# Patient Record
Sex: Male | Born: 1965 | Race: White | Hispanic: No | Marital: Married | Smoking: Never smoker
Health system: Southern US, Community
[De-identification: ages and names within clinical notes are randomized; demographics above are authoritative.]

## PROBLEM LIST (undated history)

## (undated) DIAGNOSIS — T753XXA Motion sickness, initial encounter: Secondary | ICD-10-CM

## (undated) HISTORY — PX: NO PAST SURGERIES: SHX2092

---

## 2013-04-13 ENCOUNTER — Ambulatory Visit: Payer: Self-pay | Admitting: Orthopedic Surgery

## 2015-07-25 ENCOUNTER — Ambulatory Visit (INDEPENDENT_AMBULATORY_CARE_PROVIDER_SITE_OTHER): Payer: PRIVATE HEALTH INSURANCE

## 2015-07-25 ENCOUNTER — Ambulatory Visit (INDEPENDENT_AMBULATORY_CARE_PROVIDER_SITE_OTHER): Payer: PRIVATE HEALTH INSURANCE | Admitting: Podiatry

## 2015-07-25 ENCOUNTER — Encounter: Payer: Self-pay | Admitting: Podiatry

## 2015-07-25 VITALS — BP 124/69 | HR 61 | Resp 16 | Ht 70.0 in | Wt 215.0 lb

## 2015-07-25 DIAGNOSIS — M779 Enthesopathy, unspecified: Secondary | ICD-10-CM

## 2015-07-25 DIAGNOSIS — M722 Plantar fascial fibromatosis: Secondary | ICD-10-CM | POA: Diagnosis not present

## 2015-07-25 MED ORDER — MELOXICAM 15 MG PO TABS: 15.0000 mg | ORAL_TABLET | Freq: Every day | ORAL | Status: DC

## 2015-07-25 MED ORDER — METHYLPREDNISOLONE 4 MG PO TBPK: ORAL_TABLET | ORAL | Status: DC

## 2015-07-25 NOTE — Patient Instructions (Signed)

## 2015-07-25 NOTE — Progress Notes (Signed)
   Subjective:    Patient ID: Joseph George, male    DOB: 11-Jan-1966, 49 y.o.   MRN: [id number]  HPI Comments: "I have pain in the bottom of my foot"  Patient c/o aching plantar forefoot right for a couple months. Initially, the pain was in the heel and noticed a lot of AM pain, but that has improved and more in forefoot now. Tried OTC insoles and has helped some.      Review of Systems  All other systems reviewed and are negative.      Objective:   Physical Exam: I have reviewed his past medical history his chief complaint medications allergy surgery social history and review of systems. Pulses are strongly palpable bilateral. Neurologic sensorium is intact percent one C monofilament. Deep tendon reflexes are intact bilateral and muscle strength is 5 over 5 dorsiflexion plantar flexors and inverters everters all intrinsic musculature is intact. Orthopedic evaluation demonstrates all joints distal to the ankle level for range of motion without crepitation. Cutaneous evaluation and straight supple well-hydrated cutis no erythema edema cellulitis drainage or odor. There is pain on palpation medial calcaneal tubercle of the right heel. Radiographic evaluation does demonstrates soft tissue increasing density at the plantar fascial calcaneal insertion site of the right heel.        Assessment & Plan:  Assessment: Plantar fasciitis right.  Plan: Discussed etiology pathology conservative versus surgical therapies at this point performed a injection to his right heel today with Kenalog and local and aesthetic. Discussed appropriate shoe gear stretching exercises and ice therapy. Dispensed a night splint and a plantar fascial brace. Also dispensed a prescription for Medrol Dosepak to be followed by meloxicam. We will follow-up with him in 1 month. Should he have questions or concerns he will notify us immediately.

## 2015-08-22 ENCOUNTER — Ambulatory Visit: Payer: PRIVATE HEALTH INSURANCE | Admitting: Podiatry

## 2015-08-24 ENCOUNTER — Encounter: Payer: Self-pay | Admitting: Podiatry

## 2015-08-24 ENCOUNTER — Ambulatory Visit (INDEPENDENT_AMBULATORY_CARE_PROVIDER_SITE_OTHER): Payer: PRIVATE HEALTH INSURANCE | Admitting: Podiatry

## 2015-08-24 VITALS — BP 140/76 | HR 65 | Resp 18

## 2015-08-24 DIAGNOSIS — M722 Plantar fascial fibromatosis: Secondary | ICD-10-CM | POA: Diagnosis not present

## 2015-08-24 MED ORDER — METHYLPREDNISOLONE 4 MG PO TBPK: ORAL_TABLET | ORAL | Status: DC

## 2015-08-24 MED ORDER — MELOXICAM 15 MG PO TABS: 15.0000 mg | ORAL_TABLET | Freq: Every day | ORAL | Status: DC

## 2015-08-24 NOTE — Progress Notes (Signed)
He presents today for follow-up of his plantar fasciitis right foot. He states that it really doesn't seem to be very much better at all. He states that he never got his medicine filled and he does not wear his boot.  Objective: Vital signs are stable alert and oriented 70. 49 year old male no acute distress ambulates with an antalgic gait. Pulses are palpable. He has pain on palpation medial calcaneal tubercle right heel. Neurologic sensory is intact. Deep tendon reflexes are intact. Muscle strength is normal bilateral.  Assessment: Chronic intractable plantar fasciitis right foot secondary to noncompliance.  Plan: Encouraged him to purchase and take his medication. Reinjected his right heel today. Encouraged him to utilize his plantar fascial brace and night splint. Follow up with him in 1 month at which time we will more than likely after repair orthotics built.

## 2015-09-21 ENCOUNTER — Encounter: Payer: Self-pay | Admitting: Podiatry

## 2015-09-21 ENCOUNTER — Ambulatory Visit (INDEPENDENT_AMBULATORY_CARE_PROVIDER_SITE_OTHER): Payer: PRIVATE HEALTH INSURANCE | Admitting: Podiatry

## 2015-09-21 VITALS — BP 115/70 | HR 67 | Resp 18

## 2015-09-21 DIAGNOSIS — M7751 Other enthesopathy of right foot: Secondary | ICD-10-CM | POA: Diagnosis not present

## 2015-09-21 DIAGNOSIS — M722 Plantar fascial fibromatosis: Secondary | ICD-10-CM | POA: Diagnosis not present

## 2015-09-21 DIAGNOSIS — M779 Enthesopathy, unspecified: Secondary | ICD-10-CM

## 2015-09-21 DIAGNOSIS — M778 Other enthesopathies, not elsewhere classified: Secondary | ICD-10-CM

## 2015-09-21 MED ORDER — DICLOFENAC SODIUM 75 MG PO TBEC: 75.0000 mg | DELAYED_RELEASE_TABLET | Freq: Two times a day (BID) | ORAL | Status: DC

## 2015-09-21 NOTE — Progress Notes (Signed)
He presents today states that his right heel is doing much better. He states that he no longer takes his medications or uses his splints. He continues to play but basketball on a regular basis and has noticed that his forefoot is starting to bother him. He states that his only after playing ball that he has some tenderness right here as he points to the second metatarsophalangeal joint of the right foot. He denies any other changes in his past mental history medications or allergies.  Objective: Vital signs are stable alert and oriented 3. Pulses are strongly palpable. Neurologic sensorium is intact. He has no reproducible pain on palpation or range of motion of the second metatarsophalangeal joint. No reproducible pain on palpation of the medial calcaneal tubercle of the right heel. He has no plantar pain in her midfoot on frontal plane range of motion has no pain on palpation of the Achilles or the peroneals or of the posterior tibial tendon.  Assessment: Well-healing plantar fasciitis right. Occasional capsulitis second metatarsophalangeal joint right.  Plan: Discussed etiology pathology conservative versus surgical therapies. I changed his medication from meloxicam to diclofenac 75 mg 1 by mouth twice a day 60 with 3 refills. We discussed appropriate shoe gear stretching sizes ice therapy and shoe gear modifications. Should his pain about the second metatarsophalangeal joint worsen he will notify us immediately.  Arbutus Ped DPM

## 2015-10-17 DIAGNOSIS — S46911A Strain of unspecified muscle, fascia and tendon at shoulder and upper arm level, right arm, initial encounter: Secondary | ICD-10-CM | POA: Insufficient documentation

## 2015-10-17 DIAGNOSIS — S161XXA Strain of muscle, fascia and tendon at neck level, initial encounter: Secondary | ICD-10-CM | POA: Insufficient documentation

## 2016-05-17 ENCOUNTER — Other Ambulatory Visit: Payer: Self-pay | Admitting: Orthopedic Surgery

## 2016-05-17 DIAGNOSIS — M2392 Unspecified internal derangement of left knee: Secondary | ICD-10-CM

## 2016-05-17 DIAGNOSIS — M25562 Pain in left knee: Secondary | ICD-10-CM

## 2016-05-29 ENCOUNTER — Ambulatory Visit
Admission: RE | Admit: 2016-05-29 | Discharge: 2016-05-29 | Disposition: A | Discharge status: Home / Self Care | Payer: Managed Care, Other (non HMO) | Source: Ambulatory Visit | Attending: Orthopedic Surgery | Admitting: Orthopedic Surgery

## 2016-05-29 DIAGNOSIS — M25562 Pain in left knee: Secondary | ICD-10-CM | POA: Insufficient documentation

## 2016-05-29 DIAGNOSIS — M2392 Unspecified internal derangement of left knee: Secondary | ICD-10-CM | POA: Diagnosis present

## 2016-06-19 DIAGNOSIS — M2392 Unspecified internal derangement of left knee: Secondary | ICD-10-CM | POA: Insufficient documentation

## 2016-12-10 IMAGING — MR MR KNEE*L* W/O CM
6 series · 38 of 40 positions shown · non-contrast
Comparison: None.

CLINICAL DATA: Medial knee pain.  Injured playing basketball.

EXAM:
MRI OF THE LEFT KNEE WITHOUT CONTRAST
TECHNIQUE: Multiplanar, multisequence MR imaging of the knee was performed. No
intravenous contrast was administered.

[Series 3: PD fat-sat · axial · 3.0mm · 0.33mm/px · z∈[-60,+52]mm · 9 of 35 slices shown (1 of 4)]
[im 1/35]
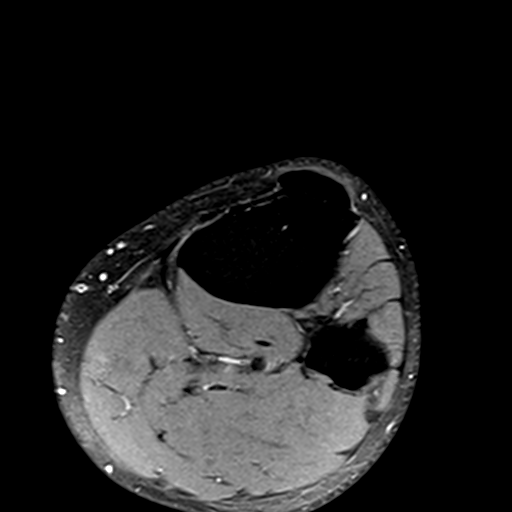
[im 5/35]
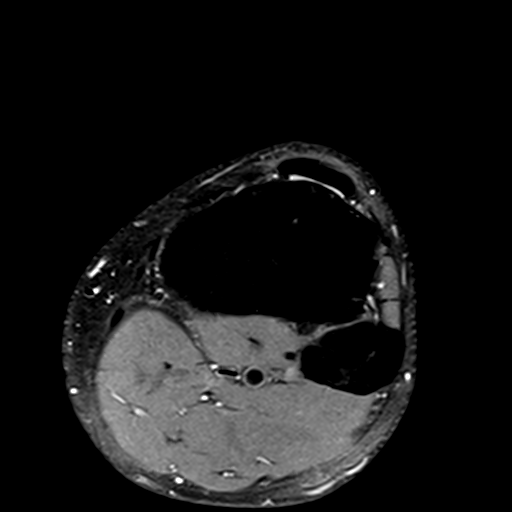
[im 9/35]
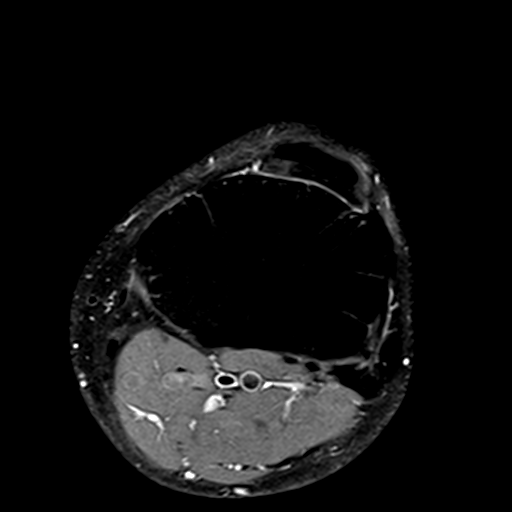
[im 13/35]
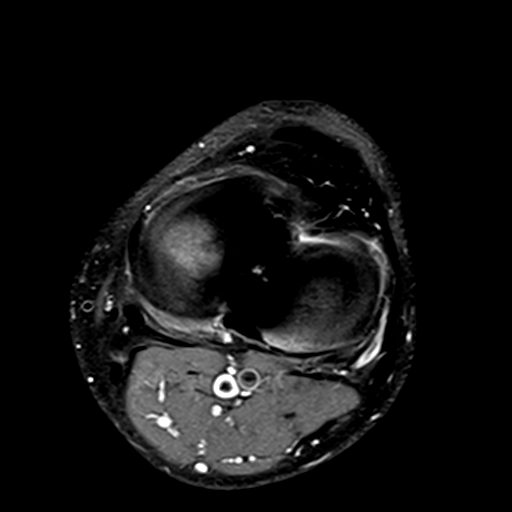
[im 18/35]
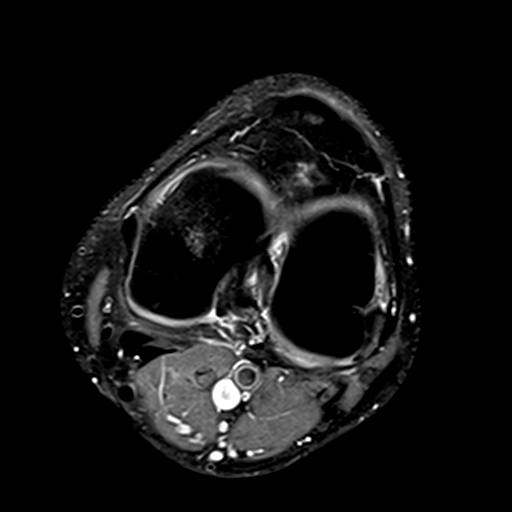
[im 22/35]
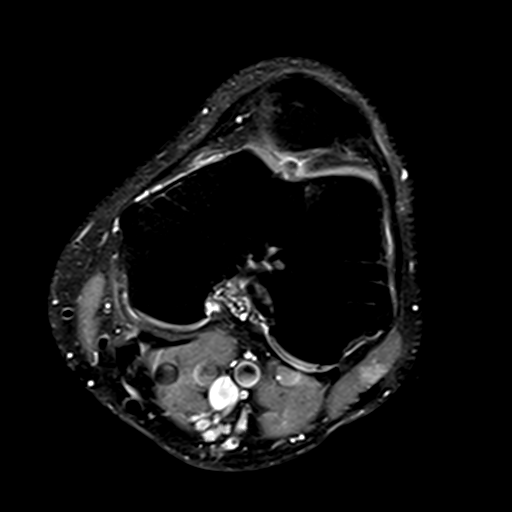
[im 26/35]
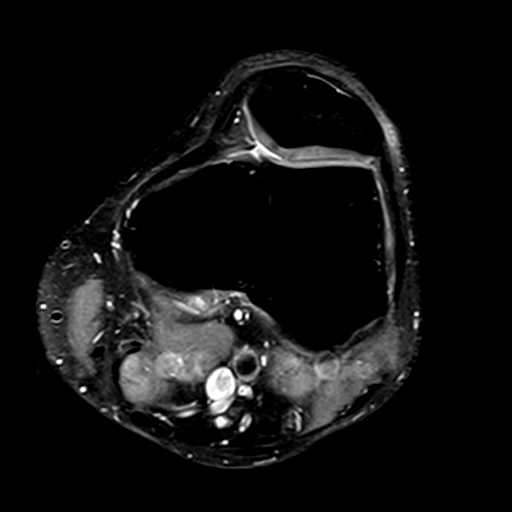
[im 30/35]
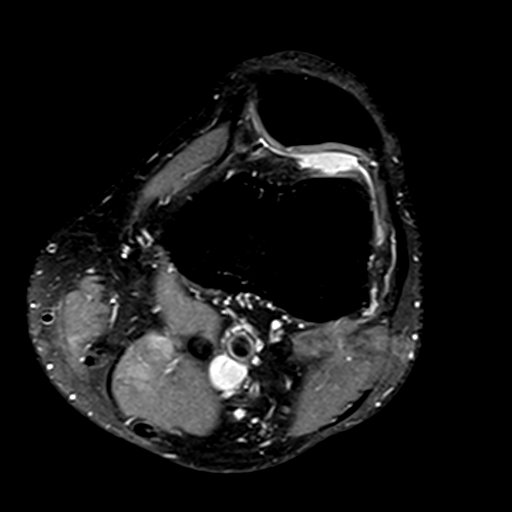
[im 35/35]
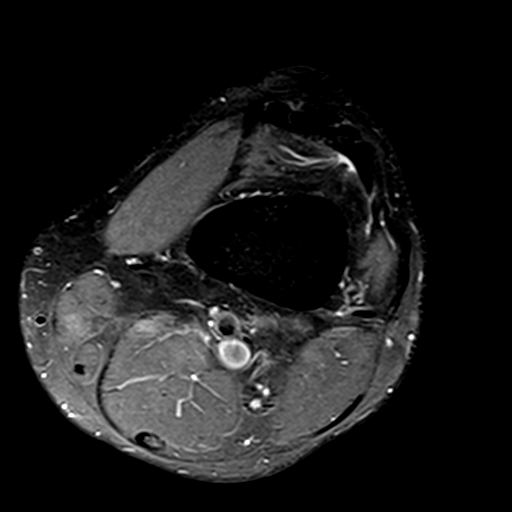

[Series 4: T1 · coronal · 3.0mm · 0.50mm/px · 5 of 34 slices shown]
[im 1/34]
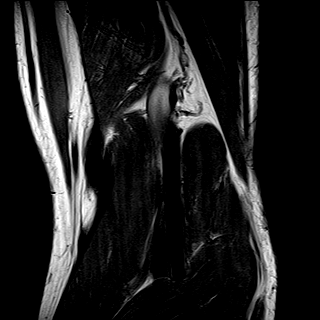
[im 6/34]
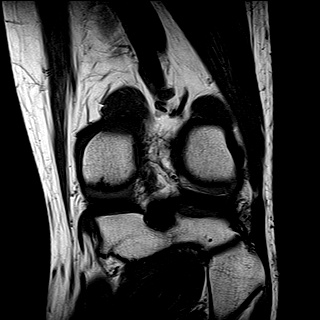
[im 12/34]
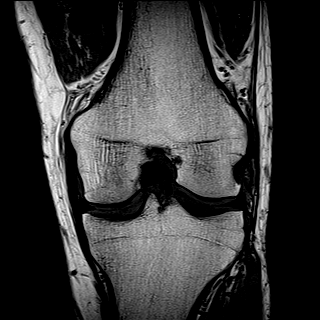
[im 17/34]
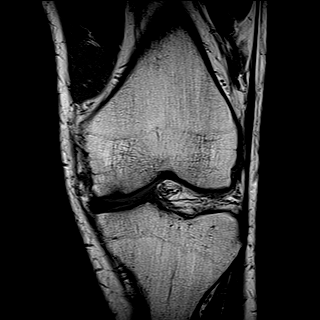
[im 23/34]
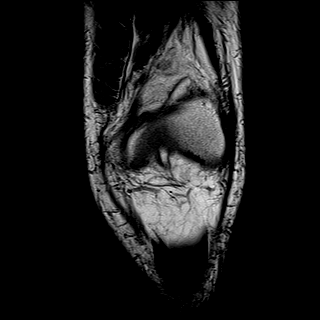

[Series 5: PD fat-sat · sagittal · 3.0mm · 0.62mm/px · 7 of 33 slices shown (2 of 4)]
[im 1/33]
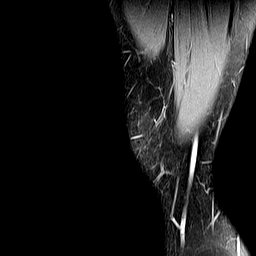
[im 6/33]
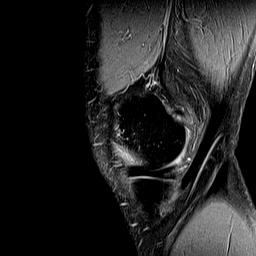
[im 11/33]
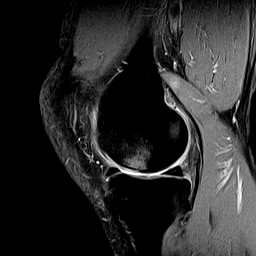
[im 17/33]
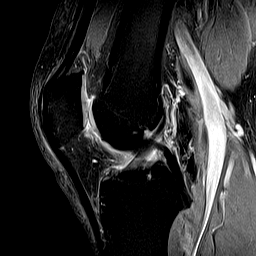
[im 22/33]
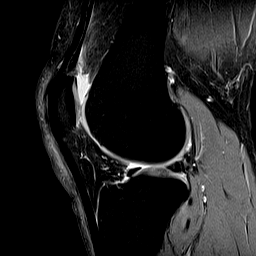
[im 27/33]
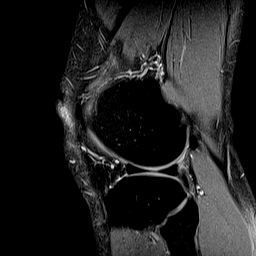
[im 33/33]
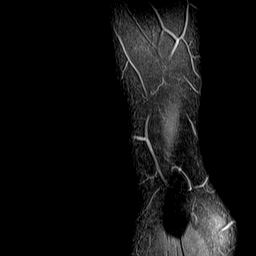

[Series 6: T2 fat-sat · coronal · 3.0mm · 0.50mm/px · 7 of 34 slices shown]
[im 1/34]
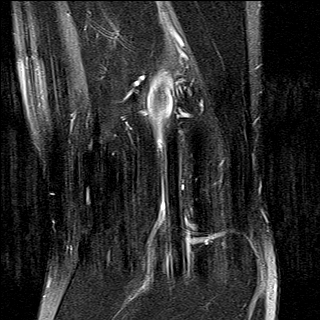
[im 6/34]
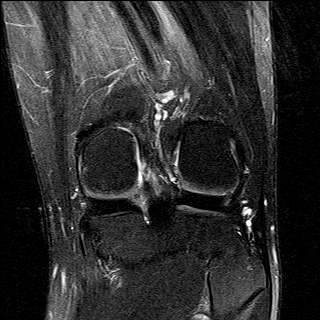
[im 12/34]
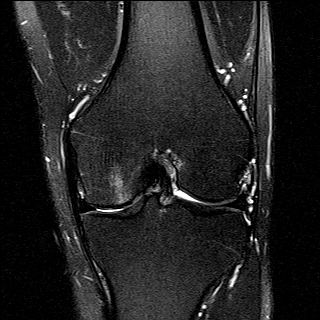
[im 17/34]
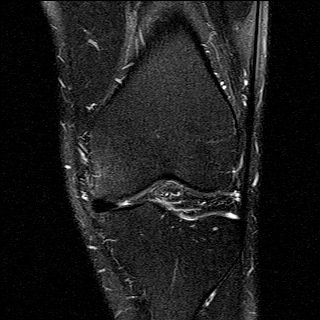
[im 23/34]
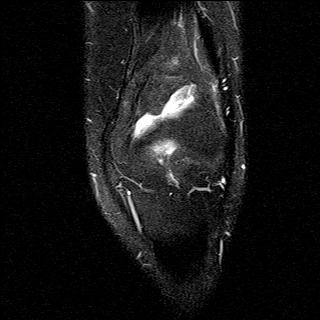
[im 28/34]
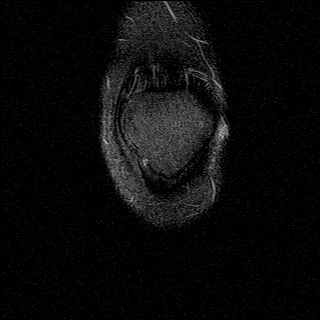
[im 34/34]
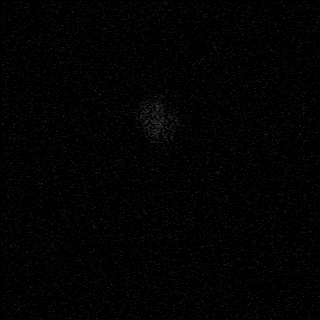

[Series 7: PD fat-sat · coronal · 3.0mm · 0.62mm/px · 7 of 34 slices shown (3 of 4)]
[im 1/34]
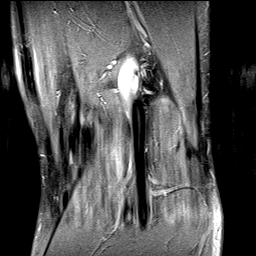
[im 6/34]
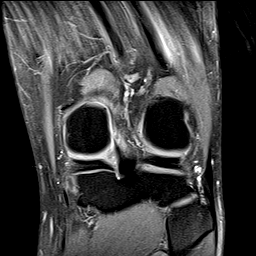
[im 12/34]
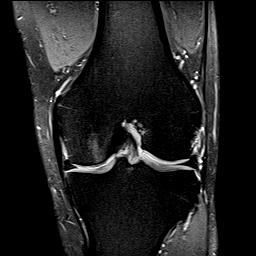
[im 17/34]
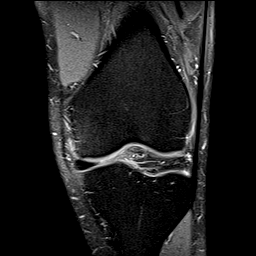
[im 23/34]
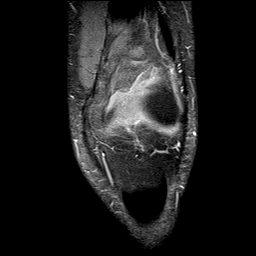
[im 28/34]
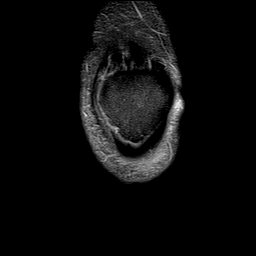
[im 34/34]
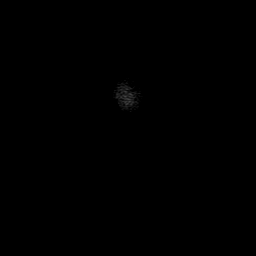

[Series 8: PD fat-sat · oblique · 2.0mm · 0.62mm/px · 3 of 14 slices shown (4 of 4)]
[im 1/14]
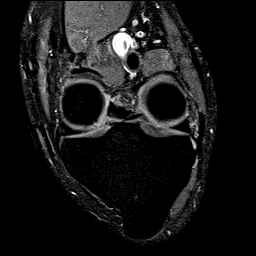
[im 7/14]
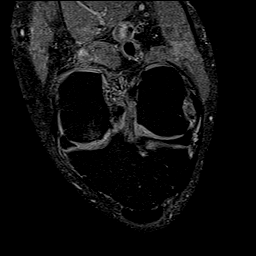
[im 14/14]
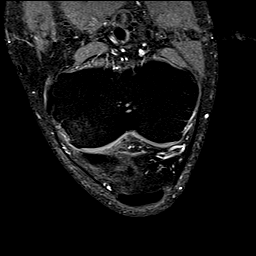

[38 of 40 positions shown; findings below may reference images not displayed]

FINDINGS: MENISCI

Medial meniscus: Increased signal in the posterior horn of the
medial meniscus consistent with degeneration. No discrete meniscal
tear.

Lateral meniscus:  Intact.

LIGAMENTS

Cruciates:  Intact ACL and PCL.

Collaterals: Medial collateral ligament is intact. Lateral
collateral ligament complex is intact.

CARTILAGE

Patellofemoral: Cartilage fissuring of the patellar apex.
Full-thickness cartilage fissuring of the trochlear groove with
minimal subchondral reactive marrow changes.

Medial: High-grade partial-thickness cartilage loss with
full-thickness cartilage fissuring of the medial femoral condyle
with subchondral reactive marrow edema. Partial-thickness cartilage
loss of the medial tibial plateau.

Lateral:  Chondromalacia of the lateral tibial plateau.

Joint: No joint effusion. Mild edema in Hoffa's fat. No plical
thickening.

Popliteal Fossa:  No Baker cyst.  Intact popliteus tendon.

Extensor Mechanism:  Intact.

Bones: No focal marrow signal abnormality. No fracture or
dislocation.
IMPRESSION: 1. Tricompartmental cartilage abnormalities as described above.

## 2016-12-27 ENCOUNTER — Other Ambulatory Visit: Payer: Self-pay

## 2016-12-27 ENCOUNTER — Encounter: Payer: Self-pay | Admitting: *Deleted

## 2016-12-27 ENCOUNTER — Ambulatory Visit (INDEPENDENT_AMBULATORY_CARE_PROVIDER_SITE_OTHER): Payer: Managed Care, Other (non HMO) | Admitting: Gastroenterology

## 2016-12-27 ENCOUNTER — Encounter: Payer: Self-pay | Admitting: Gastroenterology

## 2016-12-27 VITALS — BP 125/66 | HR 74 | Temp 98.6°F | Ht 70.0 in | Wt 221.0 lb

## 2016-12-27 DIAGNOSIS — K921 Melena: Secondary | ICD-10-CM

## 2016-12-27 DIAGNOSIS — K5900 Constipation, unspecified: Secondary | ICD-10-CM | POA: Diagnosis not present

## 2016-12-27 MED ORDER — PEG 3350-KCL-NA BICARB-NACL 420 G PO SOLR: ORAL | 0 refills | Status: DC

## 2016-12-27 NOTE — Progress Notes (Addendum)
Gastroenterology Consultation  Referring Provider:     No ref. provider found Primary Care Physician:  No PCP Per Patient Primary Gastroenterologist:  Dr. Servando Snare     Reason for Consultation:     Rectal bleeding        HPI:   Joseph George is a 51 y.o. y/o male referred for consultation & management of Rectal bleeding by Dr. Bonnetta Barry PCP Per Patient.  This patient comes in to report of severe constipation yesterday that required him to manually manipulate his rectum to pull out the stools. The patient states he sat there for approximately one half hour and was unable to move his bowels and had severe rectal pain. The patient then put on the glove and disimpact himself. The patient denies this ever happening before. The patient also states that he has not been eating the same way he had in the past. He states 2 years ago he changed his job and has been eating differently. The patient denies any similar episodes of rectal bleeding or constipation. He also denies any family history of colon cancer colon polyps. The patient reports that he usually has approximately 3 bowel movements a day and has not had problems in the past.  History reviewed. No pertinent past medical history.  Past Surgical History:  Procedure Laterality Date  . NO PAST SURGERIES      Prior to Admission medications   Medication Sig Start Date End Date Taking? Authorizing Provider  diclofenac (VOLTAREN) 75 MG EC tablet Take 1 tablet (75 mg total) by mouth 2 (two) times daily. 09/21/15  Yes Max T Hyatt, DPM  meloxicam (MOBIC) 15 MG tablet Take 1 tablet (15 mg total) by mouth daily. Patient not taking: Reported on 12/27/2016 07/25/15   Max T Hyatt, DPM  polyethylene glycol-electrolytes (NULYTELY/GOLYTELY) 420 g solution Drink one 8 oz glass every 20 mins until stools are clear. 12/27/16   Midge Minium, MD    History reviewed. No pertinent family history.   Social History  Substance Use Topics  . Smoking status: Never Smoker  .  Smokeless tobacco: Never Used  . Alcohol use No    Allergies as of 12/27/2016  . (No Known Allergies)    Review of Systems:    All systems reviewed and negative except where noted in HPI.   Physical Exam:  BP 125/66   Pulse 74   Temp 98.6 F (37 C) (Oral)   Ht 5\' 10"  (1.778 m)   Wt 221 lb (100.2 kg)   BMI 31.71 kg/m  No LMP for male patient. Psych:  Alert and cooperative. Normal mood and affect. General:   Alert,  Well-developed, well-nourished, pleasant and cooperative in NAD Head:  Normocephalic and atraumatic. Eyes:  Sclera clear, no icterus.   Conjunctiva pink. Ears:  Normal auditory acuity. Nose:  No deformity, discharge, or lesions. Mouth:  No deformity or lesions,oropharynx pink & moist. Neck:  Supple; no masses or thyromegaly. Lungs:  Respirations even and unlabored.  Clear throughout to auscultation.   No wheezes, crackles, or rhonchi. No acute distress. Heart:  Regular rate and rhythm; no murmurs, clicks, rubs, or gallops. Abdomen:  Normal bowel sounds.  No bruits.  Soft, non-tender and non-distended without masses, hepatosplenomegaly or hernias noted.  No guarding or rebound tenderness.  Negative Carnett sign.   Rectal:  Deferred.  Msk:  Symmetrical without gross deformities.  Good, equal movement & strength bilaterally. Pulses:  Normal pulses noted. Extremities:  No clubbing or edema.  No cyanosis. Neurologic:  Alert and oriented x3;  grossly normal neurologically. Skin:  Intact without significant lesions or rashes.  No jaundice. Lymph Nodes:  No significant cervical adenopathy. Psych:  Alert and cooperative. Normal mood and affect.  Imaging Studies: No results found.  Assessment and Plan:   Joseph George is a 51 y.o. y/o male who comes in today with a episode yesterday of severe constipation with the need of manual disimpaction. The patient then had resulting rectal bleeding. The patient will be set up for colonoscopy to look for the source of his  rectal bleeding and constipation. I have discussed risks & benefits which include, but are not limited to, bleeding, infection, perforation & drug reaction.  The patient agrees with this plan & written consent will be obtained.       Midge Miniumarren Noella Kipnis, MD. Clementeen GrahamFACG   Note: This dictation was prepared with Dragon dictation along with smaller phrase technology. Any transcriptional errors that result from this process are unintentional.

## 2016-12-28 ENCOUNTER — Encounter: Payer: Self-pay | Admitting: *Deleted

## 2016-12-31 ENCOUNTER — Telehealth: Payer: Self-pay | Admitting: Gastroenterology

## 2016-12-31 NOTE — Telephone Encounter (Signed)
[phone] Patient called this morning regarding a colonoscopy Friday. He is taking a medication but can't think of the name of it and refuses to tell me why he takes it. He just wanted to discuss with you if he should like it this week. Please call.

## 2017-01-01 NOTE — Telephone Encounter (Signed)
LVM for pt to return my call.

## 2017-01-03 NOTE — Discharge Instructions (Signed)

## 2017-01-04 ENCOUNTER — Encounter
Admission: RE | Disposition: A | Discharge status: Home / Self Care | Payer: Self-pay | Source: Ambulatory Visit | Attending: Gastroenterology

## 2017-01-04 ENCOUNTER — Ambulatory Visit: Payer: Managed Care, Other (non HMO) | Admitting: Anesthesiology

## 2017-01-04 ENCOUNTER — Ambulatory Visit
Admission: RE | Admit: 2017-01-04 | Discharge: 2017-01-04 | Disposition: A | Discharge status: Home / Self Care | Payer: Managed Care, Other (non HMO) | Source: Ambulatory Visit | Attending: Gastroenterology | Admitting: Gastroenterology

## 2017-01-04 DIAGNOSIS — K921 Melena: Secondary | ICD-10-CM

## 2017-01-04 DIAGNOSIS — K641 Second degree hemorrhoids: Secondary | ICD-10-CM | POA: Diagnosis not present

## 2017-01-04 DIAGNOSIS — Z79899 Other long term (current) drug therapy: Secondary | ICD-10-CM | POA: Insufficient documentation

## 2017-01-04 HISTORY — DX: Motion sickness, initial encounter: T75.3XXA

## 2017-01-04 HISTORY — PX: COLONOSCOPY WITH PROPOFOL: SHX5780

## 2017-01-04 SURGERY — COLONOSCOPY WITH PROPOFOL
Anesthesia: Monitor Anesthesia Care | Wound class: Contaminated

## 2017-01-04 MED ORDER — OXYCODONE HCL 5 MG/5ML PO SOLN: 5.0000 mg | Freq: Once | ORAL | Status: DC | PRN

## 2017-01-04 MED ORDER — LACTATED RINGERS IV SOLN
INTRAVENOUS | Status: DC
  Administered 2017-01-04: 10:00:00 via INTRAVENOUS

## 2017-01-04 MED ORDER — STERILE WATER FOR IRRIGATION IR SOLN
Status: DC | PRN
  Administered 2017-01-04: 11:00:00

## 2017-01-04 MED ORDER — LIDOCAINE HCL (CARDIAC) 20 MG/ML IV SOLN
INTRAVENOUS | Status: DC | PRN
  Administered 2017-01-04: 40 mg via INTRAVENOUS

## 2017-01-04 MED ORDER — PROPOFOL 10 MG/ML IV BOLUS
INTRAVENOUS | Status: DC | PRN
  Administered 2017-01-04: 50 mg via INTRAVENOUS
  Administered 2017-01-04: 100 mg via INTRAVENOUS
  Administered 2017-01-04 (×3): 50 mg via INTRAVENOUS

## 2017-01-04 MED ORDER — OXYCODONE HCL 5 MG PO TABS
5.0000 mg | ORAL_TABLET | Freq: Once | ORAL | Status: DC | PRN
Start: 2017-01-04 — End: 2017-01-04

## 2017-01-04 SURGICAL SUPPLY — 23 items

## 2017-01-04 NOTE — H&P (Signed)
  Joseph George [name], MD [facility] [address] [place], [place] [address] Phone: [phone] Fax : [phone]  Primary Care Physician:  No PCP Per Patient Primary Gastroenterologist:  Dr. Servando SnareWohl  Pre-Procedure History & Physical: HPI:  Joseph George is a 51 y.o. male is here for an colonoscopy.   Past Medical History:  Diagnosis Date  . Motion sickness    Boats    Past Surgical History:  Procedure Laterality Date  . NO PAST SURGERIES      Prior to Admission medications   Medication Sig Start Date End Date Taking? Authorizing Provider  diclofenac (VOLTAREN) 75 MG EC tablet Take 1 tablet (75 mg total) by mouth 2 (two) times daily. 09/21/15  Yes Max T Hyatt, DPM  polyethylene glycol-electrolytes (NULYTELY/GOLYTELY) 420 g solution Drink one 8 oz glass every 20 mins until stools are clear. 12/27/16  Yes Joseph George Desire Fulp, MD    Allergies as of 12/27/2016  . (No Known Allergies)    History reviewed. No pertinent family history.  Social History   Social History  . Marital status: Married    Spouse name: N/A  . Number of children: N/A  . Years of education: N/A   Occupational History  . Not on file.   Social History Main Topics  . Smoking status: Never Smoker  . Smokeless tobacco: Never Used  . Alcohol use No  . Drug use: No  . Sexual activity: Not on file   Other Topics Concern  . Not on file   Social History Narrative  . No narrative on file    Review of Systems: See HPI, otherwise negative ROS  Physical Exam: BP 129/86   Pulse 61   Temp 98 F (36.7 C) (Temporal)   Resp 16   Ht 5\' 10"  (1.778 m)   Wt 209 lb (94.8 kg)   SpO2 99%   BMI 29.99 kg/m  General:   Alert,  pleasant and cooperative in NAD Head:  Normocephalic and atraumatic. Neck:  Supple; no masses or thyromegaly. Lungs:  Clear throughout to auscultation.    Heart:  Regular rate and rhythm. Abdomen:  Soft, nontender and nondistended. Normal bowel sounds, without guarding, and without rebound.     Neurologic:  Alert and  oriented x4;  grossly normal neurologically.  Impression/Plan: Joseph George is here for an colonoscopy to be performed for hematochezia  Risks, benefits, limitations, and alternatives regarding  colonoscopy have been reviewed with the patient.  Questions have been answered.  All parties agreeable.   Joseph George Wenona [place], MD  01/04/2017, 9:49 AM

## 2017-01-04 NOTE — Anesthesia Preprocedure Evaluation (Signed)
Anesthesia Evaluation  Patient identified by MRN, date of birth, ID band Patient awake    Reviewed: Allergy & Precautions, H&P , NPO status , Patient's Chart, lab work & pertinent test results  Airway Mallampati: II  TM Distance: >3 FB Neck ROM: full    Dental no notable dental hx.    Pulmonary neg pulmonary ROS,    Pulmonary exam normal        Cardiovascular negative cardio ROS Normal cardiovascular exam     Neuro/Psych    GI/Hepatic negative GI ROS, Neg liver ROS,   Endo/Other  negative endocrine ROS  Renal/GU negative Renal ROS     Musculoskeletal   Abdominal   Peds  Hematology negative hematology ROS (+)   Anesthesia Other Findings   Reproductive/Obstetrics                             Anesthesia Physical Anesthesia Plan  ASA: I  Anesthesia Plan: MAC   Post-op Pain Management:    Induction:   Airway Management Planned:   Additional Equipment:   Intra-op Plan:   Post-operative Plan:   Informed Consent: I have reviewed the patients History and Physical, chart, labs and discussed the procedure including the risks, benefits and alternatives for the proposed anesthesia with the patient or authorized representative who has indicated his/her understanding and acceptance.     Plan Discussed with:   Anesthesia Plan Comments:         Anesthesia Quick Evaluation

## 2017-01-04 NOTE — Op Note (Signed)
[facility] Gastroenterology Patient Name: Joseph George Procedure Date: 01/04/2017 10:58 AM MRN: [id number] Account #: [id number] Date of Birth: 12/17/1966 Admit Type: Outpatient Age: [age] [facility] Gender: Male Note Status: Finalized Procedure:            Colonoscopy Indications:          Hematochezia Providers:            Midge Miniumarren Johnattan Strassman MD, MD Medicines:            Propofol per Anesthesia Complications:        No immediate complications. Procedure:            Pre-Anesthesia Assessment:                       - Prior to the procedure, a History and Physical was                        performed, and patient medications and allergies were                        reviewed. The patient's tolerance of previous                        anesthesia was also reviewed. The risks and benefits of                        the procedure and the sedation options and risks were                        discussed with the patient. All questions were                        answered, and informed consent was obtained. Prior                        Anticoagulants: The patient has taken no previous                        anticoagulant or antiplatelet agents. ASA Grade                        Assessment: II - A patient with mild systemic disease.                        After reviewing the risks and benefits, the patient was                        deemed in satisfactory condition to undergo the                        procedure.                       After obtaining informed consent, the colonoscope was                        passed under direct vision. Throughout the procedure,                        the patient's blood pressure, pulse, and oxygen  saturations were monitored continuously. The Olympus CF                        H180AL colonoscope (S#: [id number]) was introduced through                        the anus and advanced to the the cecum, identified by                appendiceal orifice and ileocecal valve. The                        colonoscopy was performed without difficulty. The                        patient tolerated the procedure well. The quality of                        the bowel preparation was excellent. Findings:      The perianal and digital rectal examinations were normal.      Non-bleeding internal hemorrhoids were found during retroflexion. The       hemorrhoids were Grade II (internal hemorrhoids that prolapse but reduce       spontaneously). Impression:           - Non-bleeding internal hemorrhoids.                       - No specimens collected. Recommendation:       - Discharge patient to home.                       - Resume previous diet.                       - Continue present medications.                       - Await pathology results. Procedure Code(s):    --- Professional ---                       [phone], Colonoscopy, flexible; diagnostic, including                        collection of specimen(s) by brushing or washing, when                        performed (separate procedure) Diagnosis Code(s):    --- Professional ---                       K92.1, Melena (includes Hematochezia) CPT copyright 2016 American Medical Association. All rights reserved. The codes documented in this report are preliminary and upon coder review may  be revised to meet current compliance requirements. Midge Minium MD, MD 01/04/2017 11:22:26 AM This report has been signed electronically. Number of Addenda: 0 Note Initiated On: 01/04/2017 10:58 AM Scope Withdrawal Time: 0 hours 6 minutes 2 seconds  Total Procedure Duration: 0 hours 9 minutes 24 seconds       [facility]

## 2017-01-04 NOTE — Transfer of Care (Signed)
Immediate Anesthesia Transfer of Care Note  Patient: Joseph George  Procedure(s) Performed: Procedure(s): COLONOSCOPY WITH PROPOFOL (N/A)  Patient Location: PACU  Anesthesia Type: MAC  Level of Consciousness: awake, alert  and patient cooperative  Airway and Oxygen Therapy: Patient Spontanous Breathing and Patient connected to supplemental oxygen  Post-op Assessment: Post-op Vital signs reviewed, Patient's Cardiovascular Status Stable, Respiratory Function Stable, Patent Airway and No signs of Nausea or vomiting  Post-op Vital Signs: Reviewed and stable  Complications: No apparent anesthesia complications

## 2017-01-04 NOTE — Anesthesia Postprocedure Evaluation (Signed)
Anesthesia Post Note  Patient: Joseph George  Procedure(s) Performed: Procedure(s) (LRB): COLONOSCOPY WITH PROPOFOL (N/A)  Patient location during evaluation: PACU Anesthesia Type: MAC Level of consciousness: awake Pain management: pain level controlled Vital Signs Assessment: post-procedure vital signs reviewed and stable Respiratory status: spontaneous breathing Cardiovascular status: blood pressure returned to baseline Postop Assessment: no headache Anesthetic complications: no    Jaci Standard, III,  Chantry Headen D

## 2017-01-04 NOTE — Anesthesia Procedure Notes (Signed)
Procedure Name: MAC Date/Time: 01/04/2017 11:04 AM Performed by: Janna Arch Pre-anesthesia Checklist: Patient identified, Patient being monitored, Emergency Drugs available and Suction available Patient Re-evaluated:Patient Re-evaluated prior to inductionOxygen Delivery Method: Nasal cannula

## 2017-01-07 ENCOUNTER — Encounter: Payer: Self-pay | Admitting: Gastroenterology

## 2018-11-04 ENCOUNTER — Telehealth: Payer: Self-pay

## 2018-11-04 NOTE — Telephone Encounter (Signed)
Called patient to verify pcp and if patient has seen cardiology before.  Someone answered and said this was an incorrect number. Noone with this name lived there.   Called patients wife and she verified that he has not seen any other cardiologist and has no PCP

## 2018-11-17 ENCOUNTER — Ambulatory Visit: Payer: Managed Care, Other (non HMO) | Admitting: Cardiovascular Disease

## 2018-11-17 ENCOUNTER — Encounter

## 2019-05-15 ENCOUNTER — Other Ambulatory Visit: Payer: Self-pay | Admitting: Neurosurgery

## 2019-05-15 ENCOUNTER — Other Ambulatory Visit (HOSPITAL_COMMUNITY): Payer: Self-pay | Admitting: Neurosurgery

## 2019-05-15 DIAGNOSIS — M5412 Radiculopathy, cervical region: Secondary | ICD-10-CM

## 2019-05-20 ENCOUNTER — Other Ambulatory Visit: Payer: Self-pay | Admitting: Neurosurgery

## 2019-05-20 DIAGNOSIS — M5412 Radiculopathy, cervical region: Secondary | ICD-10-CM

## 2019-05-28 ENCOUNTER — Ambulatory Visit: Payer: Managed Care, Other (non HMO)

## 2019-12-28 ENCOUNTER — Ambulatory Visit: Payer: Self-pay | Attending: Internal Medicine

## 2020-08-26 ENCOUNTER — Ambulatory Visit (INDEPENDENT_AMBULATORY_CARE_PROVIDER_SITE_OTHER): Payer: Managed Care, Other (non HMO) | Admitting: Family Medicine

## 2020-08-26 ENCOUNTER — Other Ambulatory Visit: Payer: Self-pay

## 2020-08-26 VITALS — BP 121/71 | HR 61 | Temp 98.4°F | Resp 17 | Ht 71.0 in | Wt 209.8 lb

## 2020-08-26 DIAGNOSIS — Z024 Encounter for examination for driving license: Secondary | ICD-10-CM

## 2020-08-26 NOTE — Assessment & Plan Note (Signed)
DOT Certificate provided x 2 years  Hearing test: Pass at 15' Vision: 20/30 R, 20/25 L, 20/25 Both Uncorrected Urine 1.025, Neg Protein, Neg Glucose, Neg Hematuria

## 2020-08-26 NOTE — Progress Notes (Signed)
Subjective:    Patient ID: Joseph George, male    DOB: April 08, 1966, 54 y.o.   MRN: [id number]  Joseph George is a 54 y.o. 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  Social History   Tobacco Use  . Smoking status: Never Smoker  . Smokeless tobacco: Never Used  Substance Use Topics  . Alcohol use: No    Alcohol/week: 0.0 standard drinks  . Drug use: No    Review of Systems  Constitutional: Negative.   HENT: Negative.   Eyes: Negative.   Respiratory: Negative.   Cardiovascular: Negative.   Gastrointestinal: Negative.   Endocrine: Negative.   Genitourinary: Negative.   Musculoskeletal: Negative.   Skin: Negative.   Allergic/Immunologic: Negative.   Neurological: Negative.   Hematological: Negative.   Psychiatric/Behavioral: Negative.    Per HPI unless specifically indicated above     Objective:    BP 121/71 (BP Location: Left Arm, Patient Position: Sitting, Cuff Size: Large)   Pulse 61   Temp 98.4 F (36.9 C) (Oral)   Resp 17   Ht 5\' 11"  (1.803 m)   Wt 209 lb 12.8 oz (95.2 kg)   SpO2 99%   BMI 29.26 kg/m   Wt Readings from Last 3 Encounters:  08/26/20 209 lb 12.8 oz (95.2 kg)  01/04/17 209 lb (94.8 kg)  12/27/16 221 lb (100.2 kg)    Physical Exam Vitals reviewed.  Constitutional:      General: He is not in acute distress.    Appearance: Normal appearance. He is well-developed, well-groomed and overweight. He is not ill-appearing or toxic-appearing.  HENT:     Head: Normocephalic.     Right Ear: Tympanic membrane, ear canal and external ear normal. There is no impacted cerumen.     Left Ear: Tympanic membrane, ear canal and external ear normal. There is no impacted cerumen.     Nose: Nose normal. No congestion or rhinorrhea.     Mouth/Throat:     Mouth: Mucous membranes are moist.     Pharynx: Oropharynx is clear. No oropharyngeal exudate or posterior  oropharyngeal erythema.  Eyes:     General: Lids are normal. Vision grossly intact. No scleral icterus.       Right eye: No discharge.        Left eye: No discharge.     Extraocular Movements: Extraocular movements intact.     Conjunctiva/sclera: Conjunctivae normal.     Pupils: Pupils are equal, round, and reactive to light.  Cardiovascular:     Rate and Rhythm: Normal rate and regular rhythm.     Pulses: Normal pulses.          Dorsalis pedis pulses are 2+ on the right side and 2+ on the left side.     Heart sounds: Normal heart sounds. No murmur heard.  No friction rub. No gallop.   Pulmonary:     Effort: Pulmonary effort is normal. No respiratory distress.     Breath sounds: Normal breath sounds. No wheezing, rhonchi or rales.  Abdominal:     General: Abdomen is flat. Bowel sounds are normal. There is no distension.     Palpations: Abdomen is soft. There is no hepatomegaly, splenomegaly or mass.     Tenderness: There is no abdominal tenderness. There is no right CVA tenderness, left CVA tenderness, guarding or rebound.     Hernia: No hernia  is present.  Musculoskeletal:        General: Normal range of motion.     Cervical back: Normal range of motion and neck supple. No rigidity or tenderness.     Right lower leg: No edema.     Left lower leg: No edema.     Comments: Normal tone, 5/5 strength BUE & BLE  Feet:     Right foot:     Skin integrity: Skin integrity normal.     Left foot:     Skin integrity: Skin integrity normal.  Lymphadenopathy:     Cervical: No cervical adenopathy.  Skin:    General: Skin is warm and dry.     Capillary Refill: Capillary refill takes less than 2 seconds.  Neurological:     General: No focal deficit present.     Mental Status: He is alert and oriented to person, place, and time.     Cranial Nerves: Cranial nerves are intact. No cranial nerve deficit.     Sensory: Sensation is intact. No sensory deficit.     Motor: Motor function is intact.  No weakness.     Coordination: Coordination is intact. Coordination normal.     Gait: Gait is intact. Gait normal.     Deep Tendon Reflexes: Reflexes are normal and symmetric. Reflexes normal.  Psychiatric:        Attention and Perception: Attention and perception normal.        Mood and Affect: Mood and affect normal.        Speech: Speech normal.        Behavior: Behavior normal. Behavior is cooperative.        Thought Content: Thought content normal.        Cognition and Memory: Cognition and memory normal.        Judgment: Judgment normal.    No results found for this or any previous visit.    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   Follow up plan: Return in about 2 years (around 08/26/2022) for DOT PE.   Charlaine Dalton, FNP Family Nurse Practitioner [facility] 08/26/2020, 11:14 AM

## 2020-08-26 NOTE — Patient Instructions (Signed)
DOT certificate provided x 2 years 

## 2023-08-29 ENCOUNTER — Other Ambulatory Visit: Payer: Self-pay | Admitting: Internal Medicine

## 2023-08-29 DIAGNOSIS — R002 Palpitations: Secondary | ICD-10-CM

## 2023-08-29 DIAGNOSIS — R55 Syncope and collapse: Secondary | ICD-10-CM

## 2023-08-30 ENCOUNTER — Ambulatory Visit
Admission: RE | Admit: 2023-08-30 | Discharge: 2023-08-30 | Disposition: A | Discharge status: Home / Self Care | Payer: Self-pay | Source: Ambulatory Visit | Attending: Internal Medicine | Admitting: Internal Medicine

## 2023-08-30 DIAGNOSIS — R002 Palpitations: Secondary | ICD-10-CM

## 2023-08-30 DIAGNOSIS — R55 Syncope and collapse: Secondary | ICD-10-CM

## 2024-01-12 ENCOUNTER — Emergency Department
Admission: EM | Admit: 2024-01-12 | Discharge: 2024-01-12 | Disposition: A | Discharge status: Home / Self Care | Payer: Self-pay | Attending: Emergency Medicine | Admitting: Emergency Medicine

## 2024-01-12 ENCOUNTER — Other Ambulatory Visit: Payer: Self-pay

## 2024-01-12 ENCOUNTER — Emergency Department: Payer: Self-pay

## 2024-01-12 DIAGNOSIS — U071 COVID-19: Secondary | ICD-10-CM | POA: Insufficient documentation

## 2024-01-12 DIAGNOSIS — R079 Chest pain, unspecified: Secondary | ICD-10-CM | POA: Insufficient documentation

## 2024-01-12 DIAGNOSIS — R066 Hiccough: Secondary | ICD-10-CM | POA: Insufficient documentation

## 2024-01-12 LAB — CBC WITH DIFFERENTIAL/PLATELET
Abs Immature Granulocytes: 0.01 10*3/uL (ref 0.00–0.07)
Basophils Absolute: 0 10*3/uL (ref 0.0–0.1)
Basophils Relative: 1 %
Eosinophils Absolute: 0 10*3/uL (ref 0.0–0.5)
Eosinophils Relative: 0 %
HCT: 43.7 % (ref 39.0–52.0)
Hemoglobin: 15.2 g/dL (ref 13.0–17.0)
Immature Granulocytes: 0 %
Lymphocytes Relative: 43 %
Lymphs Abs: 2.9 10*3/uL (ref 0.7–4.0)
MCH: 30.9 pg (ref 26.0–34.0)
MCHC: 34.8 g/dL (ref 30.0–36.0)
MCV: 88.8 fL (ref 80.0–100.0)
Monocytes Absolute: 0.9 10*3/uL (ref 0.1–1.0)
Monocytes Relative: 14 %
Neutro Abs: 2.8 10*3/uL (ref 1.7–7.7)
Neutrophils Relative %: 42 %
Platelets: 168 10*3/uL (ref 150–400)
RBC: 4.92 MIL/uL (ref 4.22–5.81)
RDW: 12.3 % (ref 11.5–15.5)
WBC: 6.7 10*3/uL (ref 4.0–10.5)
nRBC: 0 % (ref 0.0–0.2)

## 2024-01-12 LAB — COMPREHENSIVE METABOLIC PANEL
ALT: 29 U/L (ref 0–44)
AST: 29 U/L (ref 15–41)
Albumin: 3.8 g/dL (ref 3.5–5.0)
Alkaline Phosphatase: 38 U/L (ref 38–126)
Anion gap: 10 (ref 5–15)
BUN: 8 mg/dL (ref 6–20)
CO2: 28 mmol/L (ref 22–32)
Calcium: 8.6 mg/dL — ABNORMAL LOW (ref 8.9–10.3)
Chloride: 92 mmol/L — ABNORMAL LOW (ref 98–111)
Creatinine, Ser: 0.74 mg/dL (ref 0.61–1.24)
GFR, Estimated: >60 mL/min (ref >60)
Glucose, Bld: 112 mg/dL — ABNORMAL HIGH (ref 70–99)
Potassium: 3.7 mmol/L (ref 3.5–5.1)
Sodium: 130 mmol/L — ABNORMAL LOW (ref 135–145)
Total Bilirubin: 0.4 mg/dL (ref 0.0–1.2)
Total Protein: 6.9 g/dL (ref 6.5–8.1)

## 2024-01-12 LAB — TROPONIN I (HIGH SENSITIVITY): Troponin I (High Sensitivity): 3 ng/L (ref <18)

## 2024-01-12 MED ORDER — GUAIFENESIN-CODEINE 100-10 MG/5ML PO SOLN: 5.0000 mL | Freq: Four times a day (QID) | ORAL | 0 refills | Status: AC | PRN

## 2024-01-12 MED ORDER — GABAPENTIN 100 MG PO CAPS: ORAL_CAPSULE | ORAL | 0 refills | Status: AC

## 2024-01-12 NOTE — ED Provider Notes (Signed)
[facility] Provider Note    Event Date/Time   First MD Initiated Contact with Patient 01/12/24 1143     (approximate)  History   Chief Complaint: Chest Pain  HPI  Joseph George is a 58 y.o. male with no significant past medical history presents to the emergency department for chest pain cough congestion.  According to the patient since Wednesday he has been experiencing cough congestion chest pain.  He went to urgent care today and tested positive for COVID (I was able to review this test).  However given the patient's chest pain he was referred to the emergency department for further evaluation.  As a secondary complaint patient states since Wednesday he has had persistent hiccups.  States they will go away at times for 2 to 3 hours and then come back.  Throughout my evaluation patient is hiccuping every 10 seconds or so.  Physical Exam   Triage Vital Signs: ED Triage Vitals  Encounter Vitals Group     BP 01/12/24 1046 122/83     Systolic BP Percentile --      Diastolic BP Percentile --      Pulse Rate 01/12/24 1046 86     Resp 01/12/24 1046 20     Temp 01/12/24 1046 99.3 F (37.4 C)     Temp Source 01/12/24 1046 Oral     SpO2 01/12/24 1046 95 %     Weight 01/12/24 1047 215 lb (97.5 kg)     Height 01/12/24 1047 5\' 11"  (1.803 m)     Head Circumference --      Peak Flow --      Pain Score --      Pain Loc --      Pain Education --      Exclude from Growth Chart --     Most recent vital signs: Vitals:   01/12/24 1046  BP: 122/83  Pulse: 86  Resp: 20  Temp: 99.3 F (37.4 C)  SpO2: 95%    General: Awake, no distress.  CV:  Good peripheral perfusion.  Regular rate and rhythm  Resp:  Normal effort.  Equal breath sounds bilaterally.  Abd:  No distention.    ED Results / Procedures / Treatments   EKG  EKG viewed and interpreted by myself shows a normal sinus rhythm 86 bpm with a narrow QRS, normal axis, normal intervals, no concerning  ST changes.  RADIOLOGY  I have reviewed and interpreted chest x-ray images.  No consolidation on my evaluation. Radiology has read the x-ray is negative   MEDICATIONS ORDERED IN ED: Medications - No data to display   IMPRESSION / MDM / ASSESSMENT AND PLAN / ED COURSE  I reviewed the triage vital signs and the nursing notes.  Patient's presentation is most consistent with acute presentation with potential threat to life or bodily function.  Patient presents to the emergency department with chest discomfort cough congestion tested positive for COVID this morning.  Patient's workup is reassuring CBC is normal to normal white blood cell count, chemistry is normal and troponin is reassuringly negative.  Patient's vital signs are reassuring with a 95% room air saturation.  No pleuritic pain.  We will treat with cough medication discussed supportive care otherwise.  Has a secondary complaint patient states since Wednesday when his upper respiratory symptoms developed he has had persistent hiccups that occur every 10 seconds or so.  Throughout my evaluation hiccups are occurring every 10 seconds.  He states  they will go away for a couple hours however they always come back.  Will prescribe the patient gabapentin which has been shown to be fairly effective for intractable hiccups.  Patient agreeable to this plan.  He will follow-up with his doctor.  FINAL CLINICAL IMPRESSION(S) / ED DIAGNOSES   Chest pain COVID-19 Intractable hiccups   Note:  This document was prepared using Dragon voice recognition software and may include unintentional dictation errors.   Minna Antis, MD 01/12/24 1207

## 2024-01-12 NOTE — ED Triage Notes (Signed)
Pt seen at urgent care prior to coming in. They diagnosed him with covid, flu, and possibly walking pneumonia. Pt sent here for cardiac work up because he has had hiccups since Wednesday and c/o L shoulder pain. Pt describes it as midsternal pressure. Pt sees Dr Elijah Birk (cardiology) and had recent work up for palpitations.

## 2024-04-07 ENCOUNTER — Other Ambulatory Visit: Payer: Self-pay | Admitting: Emergency Medicine

## 2024-04-07 DIAGNOSIS — N50819 Testicular pain, unspecified: Secondary | ICD-10-CM

## 2024-04-09 ENCOUNTER — Ambulatory Visit
Admission: RE | Admit: 2024-04-09 | Discharge: 2024-04-09 | Disposition: A | Discharge status: Home / Self Care | Source: Ambulatory Visit | Attending: Emergency Medicine | Admitting: Emergency Medicine

## 2024-04-09 DIAGNOSIS — N50819 Testicular pain, unspecified: Secondary | ICD-10-CM

## 2024-04-10 ENCOUNTER — Other Ambulatory Visit: Payer: Self-pay

## 2025-02-16 ENCOUNTER — Ambulatory Visit: Payer: Self-pay
